# Patient Record
Sex: Male | Born: 2000 | Race: Black or African American | Hispanic: No | Marital: Single
Health system: Southern US, Community
[De-identification: ages and names within clinical notes are randomized; demographics above are authoritative.]

---
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
## 2024-07-19 DIAGNOSIS — F17220 Nicotine dependence, chewing tobacco, uncomplicated: Secondary | ICD-10-CM | POA: Insufficient documentation

## 2024-07-19 DIAGNOSIS — R109 Unspecified abdominal pain: Secondary | ICD-10-CM | POA: Insufficient documentation

## 2024-07-19 DIAGNOSIS — F1010 Alcohol abuse, uncomplicated: Secondary | ICD-10-CM | POA: Insufficient documentation

## 2024-07-19 DIAGNOSIS — F1290 Cannabis use, unspecified, uncomplicated: Secondary | ICD-10-CM | POA: Insufficient documentation

## 2024-07-19 DIAGNOSIS — I4891 Unspecified atrial fibrillation: Principal | ICD-10-CM | POA: Insufficient documentation

## 2024-07-19 DIAGNOSIS — F419 Anxiety disorder, unspecified: Secondary | ICD-10-CM | POA: Insufficient documentation

## 2024-07-20 ENCOUNTER — Other Ambulatory Visit: Payer: Self-pay

## 2024-07-20 ENCOUNTER — Observation Stay (HOSPITAL_COMMUNITY): Payer: Self-pay

## 2024-07-20 ENCOUNTER — Emergency Department (HOSPITAL_COMMUNITY): Payer: Self-pay

## 2024-07-20 ENCOUNTER — Observation Stay (HOSPITAL_COMMUNITY)
Admission: EM | Admit: 2024-07-20 | Discharge: 2024-07-20 | Disposition: A | Discharge status: Home / Self Care | Payer: Self-pay | Attending: Cardiovascular Disease | Admitting: Cardiovascular Disease

## 2024-07-20 DIAGNOSIS — I4891 Unspecified atrial fibrillation: Principal | ICD-10-CM | POA: Diagnosis present

## 2024-07-20 LAB — ECHOCARDIOGRAM COMPLETE
Area-P 1/2: 4.06 cm2
S' Lateral: 3.2 cm
Weight: 2880 [oz_av]

## 2024-07-20 LAB — CBC
HCT: 46.8 % (ref 39.0–52.0)
HCT: 49.8 % (ref 39.0–52.0)
Hemoglobin: 15.1 g/dL (ref 13.0–17.0)
Hemoglobin: 15.4 g/dL (ref 13.0–17.0)
MCH: 28.1 pg (ref 26.0–34.0)
MCH: 29.2 pg (ref 26.0–34.0)
MCHC: 30.9 g/dL (ref 30.0–36.0)
MCHC: 32.3 g/dL (ref 30.0–36.0)
MCV: 90.3 fL (ref 80.0–100.0)
MCV: 90.9 fL (ref 80.0–100.0)
Platelets: 248 K/uL (ref 150–400)
Platelets: 268 K/uL (ref 150–400)
RBC: 5.18 MIL/uL (ref 4.22–5.81)
RBC: 5.48 MIL/uL (ref 4.22–5.81)
RDW: 13.3 % (ref 11.5–15.5)
RDW: 13.6 % (ref 11.5–15.5)
WBC: 6.5 K/uL (ref 4.0–10.5)
WBC: 7.7 K/uL (ref 4.0–10.5)
nRBC: 0 % (ref 0.0–0.2)
nRBC: 0 % (ref 0.0–0.2)

## 2024-07-20 LAB — BASIC METABOLIC PANEL WITH GFR
Anion gap: 10 (ref 5–15)
Anion gap: 9 (ref 5–15)
BUN: 11 mg/dL (ref 6–20)
BUN: 11 mg/dL (ref 6–20)
CO2: 29 mmol/L (ref 22–32)
CO2: 25 mmol/L (ref 22–32)
Calcium: 9.5 mg/dL (ref 8.9–10.3)
Calcium: 8.7 mg/dL — ABNORMAL LOW (ref 8.9–10.3)
Chloride: 106 mmol/L (ref 98–111)
Chloride: 110 mmol/L (ref 98–111)
Creatinine, Ser: 0.92 mg/dL (ref 0.61–1.24)
Creatinine, Ser: 0.77 mg/dL (ref 0.61–1.24)
GFR, Estimated: >60 mL/min (ref >60)
GFR, Estimated: >60 mL/min (ref >60)
Glucose, Bld: 75 mg/dL (ref 70–99)
Glucose, Bld: 96 mg/dL (ref 70–99)
Potassium: 3.8 mmol/L (ref 3.5–5.1)
Potassium: 4.2 mmol/L (ref 3.5–5.1)
Sodium: 144 mmol/L (ref 135–145)
Sodium: 144 mmol/L (ref 135–145)

## 2024-07-20 LAB — TROPONIN T, HIGH SENSITIVITY
Troponin T High Sensitivity: <15 ng/L (ref 0–19)
Troponin T High Sensitivity: <15 ng/L (ref 0–19)

## 2024-07-20 LAB — HIV ANTIBODY (ROUTINE TESTING W REFLEX): HIV Screen 4th Generation wRfx: NONREACTIVE

## 2024-07-20 LAB — MAGNESIUM: Magnesium: 2.2 mg/dL (ref 1.7–2.4)

## 2024-07-20 LAB — TSH: TSH: 4.37 u[IU]/mL (ref 0.350–4.500)

## 2024-07-20 LAB — HEPARIN LEVEL (UNFRACTIONATED): Heparin Unfractionated: 0.38 [IU]/mL (ref 0.30–0.70)

## 2024-07-20 MED ORDER — ASPIRIN 81 MG PO TBEC: 81.0000 mg | DELAYED_RELEASE_TABLET | Freq: Every day | ORAL | Status: DC

## 2024-07-20 MED ORDER — METOPROLOL SUCCINATE ER 25 MG PO TB24: 25.0000 mg | ORAL_TABLET | Freq: Every day | ORAL | Status: DC

## 2024-07-20 MED ORDER — HEPARIN (PORCINE) 25000 UT/250ML-% IV SOLN
1200.0000 [IU]/h | INTRAVENOUS | Status: DC
  Administered 2024-07-20: 1200 [IU]/h via INTRAVENOUS
  Filled 2024-07-20: qty 250

## 2024-07-20 MED ORDER — METOPROLOL TARTRATE 5 MG/5ML IV SOLN
5.0000 mg | Freq: Once | INTRAVENOUS | Status: AC
  Administered 2024-07-20: 5 mg via INTRAVENOUS
  Filled 2024-07-20: qty 5

## 2024-07-20 MED ORDER — LACTATED RINGERS IV BOLUS
1000.0000 mL | Freq: Once | INTRAVENOUS | Status: AC
  Administered 2024-07-20: 1000 mL via INTRAVENOUS

## 2024-07-20 MED ORDER — ASPIRIN 81 MG PO TBEC: 81.0000 mg | DELAYED_RELEASE_TABLET | Freq: Every day | ORAL | Status: AC

## 2024-07-20 MED ORDER — DILTIAZEM HCL-DEXTROSE 125-5 MG/125ML-% IV SOLN (PREMIX)
5.0000 mg/h | INTRAVENOUS | Status: DC
  Administered 2024-07-20: 5 mg/h via INTRAVENOUS
  Filled 2024-07-20: qty 125

## 2024-07-20 MED ORDER — HEPARIN BOLUS VIA INFUSION
4000.0000 [IU] | Freq: Once | INTRAVENOUS | Status: AC
  Administered 2024-07-20: 4000 [IU] via INTRAVENOUS
  Filled 2024-07-20: qty 4000

## 2024-07-20 MED ORDER — METOPROLOL SUCCINATE ER 25 MG PO TB24: 25.0000 mg | ORAL_TABLET | Freq: Every day | ORAL | 3 refills | Status: AC

## 2024-07-20 MED ORDER — DILTIAZEM HCL ER COATED BEADS 120 MG PO CP24
120.0000 mg | ORAL_CAPSULE | Freq: Every day | ORAL | Status: DC
  Administered 2024-07-20: 120 mg via ORAL
  Filled 2024-07-20: qty 1

## 2024-07-20 MED ORDER — DILTIAZEM HCL ER COATED BEADS 120 MG PO CP24: 120.0000 mg | ORAL_CAPSULE | Freq: Every day | ORAL | 3 refills | Status: AC

## 2024-07-20 NOTE — ED Triage Notes (Signed)
 Pt reports SOB, chest discomfort, and racing heart that started today. Sts this happened approx 2 hours after smoking marijuana.

## 2024-07-20 NOTE — Progress Notes (Signed)
  Echocardiogram 2D Echocardiogram has been performed.  Koleen KANDICE Popper, RDCS 07/20/2024, 12:42 PM

## 2024-07-20 NOTE — Progress Notes (Signed)
 PHARMACY - ANTICOAGULATION CONSULT NOTE  Pharmacy Consult for heparin Indication: atrial fibrillation  Allergies  Allergen Reactions   Shellfish Allergy Anaphylaxis    Patient Measurements: Weight: 81.6 kg (180 lb)81.6kg  Vital Signs: Temp: 98.1 F (36.7 C) (11/01 0927) Temp Source: Oral (11/01 0927) BP: 100/85 (11/01 1100) Pulse Rate: 81 (11/01 1100)  Labs: Recent Labs    07/20/24 0026 07/20/24 0449 07/20/24 1031  HGB 15.4 15.1  --   HCT 49.8 46.8  --   PLT 248 268  --   HEPARINUNFRC  --   --  0.38  CREATININE 0.92 0.77  --     CrCl cannot be calculated (Unknown ideal weight.).   Medical History: No past medical history on file.   Assessment: 23 yo male with SOB, chest discomfort and racing heart.  Pharmacy to dose heparin drip for AFib, no prior AC noted  CBC WNL, Scr 0.92  07/20/2024 First heparin level = 0.38 - therapeutic after 4000 units bolus & heparin drip at 1200 units/hr No bleeding reported.  Afib >> NSR  Goal of Therapy:  Heparin level 0.3-0.7 units/ml Monitor platelets by anticoagulation protocol: Yes   Plan:  continue heparin drip at 1200 units/hr Daily CBC & heparin level ? DC heparin  Rosaline IVAR Edison, Pharm.D Use secure chat for questions 07/20/2024 12:04 PM

## 2024-07-20 NOTE — Progress Notes (Signed)
 Converted to Sinus rhythm at 9:42 AM. Patient c/o occasional abdominal pain with h/o hernia repair 3 years ago. He denies nausea or vomiting. Discussed medications, aspirin use and avoiding smoking Marijuana, Caffeine and other stimulants. Awaiting echocardiogram. Possible discharge home this afternoon.  Salena Negri, MD [phone] 07/20/2024, 11:35 AM

## 2024-07-20 NOTE — Progress Notes (Signed)
 Claudene MD at bedside, Okay to discharge patient. Both PIV removed, discharge instructions discussed. All questions answered. All belongings accounted for prior to discharge. Walked to ER side entrance to await ride.

## 2024-07-20 NOTE — H&P (Signed)
 Referring Physician: Jerral Meth, MD   Joel [name] is an 23 y.o. [name].                       Chief Complaint: Shortness of breath and racing heart  HPI: Joel [name] with no prior h/o heart disease has palpitations, chest discomfort and shortness of breath 2 hours after smoking Marijuana. CBC and BMET are normal. TSH is pending. EKG shows atrial fibrillation with RVR. IV metoprolol worked little.  Past medical history: Negative for DM, HTN, HLD. Positive for smoking, alcohol intake.    Past surgical history: None.  Family history: Negative for premature cardiac disease.  Social History:  reports that he is a non-smoker but has been exposed to tobacco smoke. He has never used smokeless tobacco. He reports current alcohol use. He reports that he does not use drugs except marijuana.  Allergies:  Allergies  Allergen Reactions   Shellfish Allergy Anaphylaxis    (Not in a hospital admission)   Results for orders placed or performed during the hospital encounter of 07/20/24 (from the past 48 hours)  Basic metabolic panel     Status: None   Collection Time: 07/20/24 12:26 AM  Result Value Ref Range   Sodium 144 135 - 145 mmol/L   Potassium 3.8 3.5 - 5.1 mmol/L   Chloride 106 98 - 111 mmol/L   CO2 29 22 - 32 mmol/L   Glucose, Bld 75 70 - 99 mg/dL    Comment: Glucose reference range applies only to samples taken after fasting for at least 8 hours.   BUN 11 6 - 20 mg/dL   Creatinine, Ser 9.07 0.61 - 1.24 mg/dL   Calcium 9.5 8.9 - 10.3 mg/dL   GFR, Estimated >39 >39 mL/min    Comment: (NOTE) Calculated using the CKD-EPI Creatinine Equation (2021)    Anion gap 10 5 - 15    Comment: Performed at [facility], [address] [place], [place] [address]  CBC     Status: None   Collection Time: 07/20/24 12:26 AM  Result Value Ref Range   WBC 7.7 4.0 - 10.5 K/uL   RBC 5.48 4.22 - 5.81 MIL/uL   Hemoglobin 15.4 13.0 - 17.0 g/dL   HCT 50.1  60.9 - 47.9 %   MCV 90.9 80.0 - 100.0 fL   MCH 28.1 26.0 - 34.0 pg   MCHC 30.9 30.0 - 36.0 g/dL   RDW 86.6 88.4 - 84.4 %   Platelets 248 150 - 400 K/uL   nRBC 0.0 0.0 - 0.2 %    Comment: Performed at [facility], [address] [place], [place] [address]  Troponin T, High Sensitivity     Status: None   Collection Time: 07/20/24 12:26 AM  Result Value Ref Range   Troponin T High Sensitivity <15 0 - 19 ng/L    Comment: (NOTE) Biotin concentrations > 1000 ng/mL falsely decrease TnT results.  Serial cardiac troponin measurements are suggested.  Refer to the Links section for chest pain algorithms and additional  guidance. Performed at [facility], [address] [place], [place] [address]   Magnesium     Status: None   Collection Time: 07/20/24 12:26 AM  Result Value Ref Range   Magnesium 2.2 1.7 - 2.4 mg/dL    Comment: Performed at [facility], [address] [place], [place] [address]  Troponin T, High Sensitivity  Status: None   Collection Time: 07/20/24  2:45 AM  Result Value Ref Range   Troponin T High Sensitivity <15 0 - 19 ng/L    Comment: (NOTE) Biotin concentrations > 1000 ng/mL falsely decrease TnT results.  Serial cardiac troponin measurements are suggested.  Refer to the Links section for chest pain algorithms and additional  guidance. Performed at [facility], [address] [place], [place] [address]    DG Chest Portable 1 View Result Date: 07/20/2024 CLINICAL DATA:  Chest pressure short of breath EXAM: PORTABLE CHEST 1 VIEW COMPARISON:  None Available. FINDINGS: The heart size and mediastinal contours are within normal limits. Both lungs are clear. The visualized skeletal structures are unremarkable. IMPRESSION: No active disease. Electronically Signed   By: Luke Bun M.D.   On: 07/20/2024 01:06    Review Of Systems Constitutional: No fever, chills, weight loss or  gain. Eyes: No vision change, wears glasses. No discharge or pain. Ears: No hearing loss, No tinnitus. Respiratory: No asthma, COPD, pneumonias. Positive shortness of breath. No hemoptysis. Cardiovascular: Positive chest pain, palpitation, no leg edema. Gastrointestinal: No nausea, vomiting, diarrhea, constipation. No GI bleed. No hepatitis. Genitourinary: No dysuria, hematuria, kidney stone. No incontinance. Neurological: No headache, stroke, seizures.  Psychiatry: No psych facility admission for anxiety, depression, suicide. No detox. Skin: No rash. Musculoskeletal: No joint pain, fibromyalgia. No neck pain, back pain. Lymphadenopathy: No lymphadenopathy. Hematology: No anemia or easy bruising.   Blood pressure 125/77, pulse 90, temperature 97.8 F (36.6 C), temperature source Oral, resp. rate 15, SpO2 92%. There is no height or weight on file to calculate BMI. General appearance: alert, cooperative, appears stated age and no distress Head: Normocephalic, atraumatic. Eyes: Luebke eyes, pink conjunctiva, corneas clear.  Neck: No adenopathy, no carotid bruit, no JVD, supple, symmetrical, trachea midline and thyroid not enlarged. Resp: Clear to auscultation bilaterally. Cardio: Tachycardic, Irregular rate and rhythm, S1, S2 normal, II/VI systolic murmur, no click, rub or gallop GI: Soft, non-tender; bowel sounds normal; no organomegaly. Extremities: No edema, cyanosis or clubbing. Skin: Warm and dry.  Neurologic: Alert and oriented X 3, normal strength. Normal coordination.  Assessment/Plan Atrial fibrillation with RVR, CHA2DS2VASc score of 0 Chest pain Palpitation Shortness of breath  Plan: Check TSH. IV heparin. Echocardiogram for LV function IV diltiazem for rate control. Discussed possible external cardioversion  Time spent: Review of old records, Lab, x-rays, EKG, other cardiac tests, examination, discussion with patient/Doctor/Friend over 70 minutes.  Salena GORMAN Negri,  MD  07/20/2024, 4:02 AM

## 2024-07-20 NOTE — Progress Notes (Signed)
 PHARMACY - ANTICOAGULATION CONSULT NOTE  Pharmacy Consult for heparin Indication: atrial fibrillation  Allergies  Allergen Reactions   Shellfish Allergy Anaphylaxis    Patient Measurements:  81.6kg  Vital Signs: Temp: 97.8 F (36.6 C) (11/01 0000) Temp Source: Oral (11/01 0000) BP: 125/77 (11/01 0220) Pulse Rate: 90 (11/01 0330)  Labs: Recent Labs    07/20/24 0026  HGB 15.4  HCT 49.8  PLT 248  CREATININE 0.92    CrCl cannot be calculated (Unknown ideal weight.).   Medical History: No past medical history on file.   Assessment: 23 yo male with SOB, chest discomfort and racing heart.  Pharmacy to dose heparin drip for AFib, no prior AC noted  CBC WNL, Scr 0.92  Goal of Therapy:  Heparin level 0.3-0.7 units/ml Monitor platelets by anticoagulation protocol: Yes   Plan:  Heparin bolus 4000 units x 1 Start heparin drip at 1200 units/hr Heparin level in 6 hours Daily CBC   Leeroy Mace RPh 07/20/2024, 4:05 AM

## 2024-07-20 NOTE — Discharge Summary (Signed)
 Physician Discharge Summary  Patient ID: Joel [name] MRN: [id number] DOB/AGE: 2001-05-05 23 y.o.  Admit date: 07/20/2024 Discharge date: 07/20/2024  Admission Diagnoses: Atrial fibrillation with RVR, CHA2DS2VASc score of 0 Chest pain Palpitation Shortness of breath  Discharge Diagnoses:  Principal Problem:   Atrial fibrillation with RVR (HCC) Active Problems: Chest pain Shortness of breath Non-specific abdominal pain Anxiety  Discharged Condition: fair  Hospital Course: 23 years old black male with no prior h/o heart disease has palpitations, chest discomfort and shortness of breath 2 hours after smoking Marijuana. CBC and BMET are normal. TSH is pending. EKG shows atrial fibrillation with RVR. IV metoprolol worked little. He responded well to IV diltiazem and converted to sinus rhythm in few hours. His echocardiogram showed near normal LV systolic function. He was placed on oral diltiazem and aspirin. He was advised to refrain from smoking marijuana and discontinue or limit caffeine, sugary sodas and chocolate intake. He was also advised to limit alcohol to one drink or none. He will also avoid large quantity of any liquid including water at one time. He will see primary care in 2 weeks and see me in 1 week.  Consults: cardiology  Significant Diagnostic Studies: labs: Normal CBC, BMET, TSH.  EKG: A. Fib with RVR.  CXR: Unremarkable.  X-ray abdomen: report pending.  Echocardiogram: Near normal LV systolic function with questionable basal myocardial hypokinesia.  Treatments: cardiac meds: metoprolol and diltiazem  Discharge Exam: Blood pressure 100/85, pulse 81, temperature 98.1 F (36.7 C), temperature source Oral, resp. rate (!) 22, weight 81.6 kg, SpO2 100%. General appearance: alert, cooperative and appears stated age. Head: Normocephalic, atraumatic. Eyes: Zent eyes, pink conjunctiva, corneas clear.   Neck: No adenopathy, no carotid bruit, no JVD,  supple, symmetrical, trachea midline and thyroid not enlarged. Resp: Clear to auscultation bilaterally. Cardio: Regular rate and rhythm, S1, S2 normal, II/VI systolic murmur, no click, rub or gallop. GI: Soft, non-tender; bowel sounds normal; no organomegaly. Extremities: No edema, cyanosis or clubbing. Skin: Warm and dry.  Neurologic: Alert and oriented X 3, normal strength and tone. Normal coordination and gait.  Disposition: Discharge disposition: 01-Home or Self Care        Allergies as of 07/20/2024       Reactions   Shellfish Allergy Anaphylaxis        Medication List     STOP taking these medications    traMADol  50 MG tablet Commonly known as: ULTRAM        TAKE these medications    aspirin EC 81 MG tablet Take 1 tablet (81 mg total) by mouth daily. Swallow whole.   diltiazem 120 MG 24 hr capsule Commonly known as: CARDIZEM CD Take 1 capsule (120 mg total) by mouth daily with breakfast.   metoprolol succinate 25 MG 24 hr tablet Commonly known as: TOPROL-XL Take 1 tablet (25 mg total) by mouth daily at 6 PM.        Follow-up Information     [facility]. Schedule an appointment as soon as possible for a visit in 2 week(s).   Specialty: Pediatrics Contact information: [address] [place] [address] [phone]         [name], MD. Schedule an appointment as soon as possible for a visit in 1 week(s).   Specialty: Cardiology Contact information: [address] [place] [address] [phone]                 Time spent: Review  of old chart, current chart, lab, x-ray, cardiac tests and discussion with patient over 60 minutes.  Signed: Salena GORMAN Negri 07/20/2024, 1:39 PM

## 2024-07-20 NOTE — ED Provider Notes (Signed)
 [place] EMERGENCY DEPARTMENT AT [facility] Provider Note   CSN: [id number] Arrival date & time: 07/19/24  2347     History Chief Complaint  Patient presents with   Shortness of Breath    HPI Joel [name] is a 23 y.o. male presenting for chief complaint of SOB and palpitations. Patient states that he smoked MJ then this started. No history of similar Denies any severe medical history. Patient's recorded medical, surgical, social, medication list and allergies were reviewed in the Snapshot window as part of the initial history.   Review of Systems   Review of Systems  Constitutional:  Negative for chills and fever.  HENT:  Negative for ear pain and sore throat.   Eyes:  Negative for pain and visual disturbance.  Respiratory:  Positive for shortness of breath. Negative for cough.   Cardiovascular:  Positive for chest pain and palpitations.  Gastrointestinal:  Negative for abdominal pain and vomiting.  Genitourinary:  Negative for dysuria and hematuria.  Musculoskeletal:  Negative for arthralgias and back pain.  Skin:  Negative for color change and rash.  Neurological:  Negative for seizures and syncope.  All other systems reviewed and are negative.   Physical Exam Updated Vital Signs BP 98/64   Pulse 78   Temp 98 F (36.7 C)   Resp 15   Wt 81.6 kg   SpO2 95%  Physical Exam Vitals and nursing note reviewed.  Constitutional:      General: He is not in acute distress.    Appearance: He is well-developed.  HENT:     Head: Normocephalic and atraumatic.  Eyes:     Conjunctiva/sclera: Conjunctivae normal.  Cardiovascular:     Rate and Rhythm: Normal rate and regular rhythm.     Heart sounds: No murmur heard. Pulmonary:     Effort: Pulmonary effort is normal. No respiratory distress.     Breath sounds: Normal breath sounds.  Abdominal:     Palpations: Abdomen is soft.     Tenderness: There is no abdominal tenderness.  Musculoskeletal:         General: No swelling.     Cervical back: Neck supple.  Skin:    General: Skin is warm and dry.     Capillary Refill: Capillary refill takes less than 2 seconds.  Neurological:     Mental Status: He is alert.  Psychiatric:        Mood and Affect: Mood normal.      ED Course/ Medical Decision Making/ A&P Clinical Course as of 07/20/24 0706  Sat Jul 20, 2024  0357 WBC: 7.7 [CC]    Clinical Course User Index [CC] Jerral Meth, MD    Procedures .Critical Care  Performed by: Jerral Meth, MD Authorized by: Jerral Meth, MD   Critical care provider statement:    Critical care time (minutes):  50   Critical care was necessary to treat or prevent imminent or life-threatening deterioration of the following conditions:  Circulatory failure and cardiac failure   Critical care was time spent personally by me on the following activities:  Development of treatment plan with patient or surrogate, discussions with consultants, evaluation of patient's response to treatment, examination of patient, ordering and review of laboratory studies, ordering and review of radiographic studies, ordering and performing treatments and interventions, pulse oximetry, re-evaluation of patient's condition and review of old charts   Care discussed with: admitting provider    Tachycardia >150 Medications Ordered in ED Medications  diltiazem (CARDIZEM)  125 mg in dextrose 5% 125 mL (1 mg/mL) infusion (10 mg/hr Intravenous Infusion Verify 07/20/24 0605)  heparin ADULT infusion 100 units/mL (25000 units/250mL) (1,200 Units/hr Intravenous New Bag/Given 07/20/24 0434)  lactated ringers bolus 1,000 mL (0 mLs Intravenous Stopped 07/20/24 0152)  metoprolol tartrate (LOPRESSOR) injection 5 mg (5 mg Intravenous Given 07/20/24 0101)  metoprolol tartrate (LOPRESSOR) injection 5 mg (5 mg Intravenous Given 07/20/24 0152)  heparin bolus via infusion 4,000 Units (4,000 Units Intravenous Bolus from Bag 07/20/24 0435)     Medical Decision Making:   This is a 23 year old male presenting with a chief complaint of shortness of breath, palpitations.  Stated that he smoked marijuana earlier tonight and then acutely started feeling palpitations and dyspnea.  He is ambulatory.  No history of similar.  This history of present illness and physical exam findings are most consistent with developing atrial fibrillation likely substance-induced. May be underlying metabolic etiology, lab work sent off for evaluation. Reassessment On reassessment he remained tachycardic despite multiple doses of IV fluids, beta-blocker and ongoing treatment. Cardiology was consulted who recommended admission for diltiazem and consideration for TEE cardioversion.  Disposition:   Based on the above findings, I believe this patient is stable for admission.    Patient/family educated about specific findings on our evaluation and explained exact reasons for admission.  Patient/family educated about clinical situation and time was allowed to answer questions.   Admission team communicated with and agreed with need for admission. Patient admitted. Patient ready to move at this time.     Emergency Department Medication Summary:   Medications  diltiazem (CARDIZEM) 125 mg in dextrose 5% 125 mL (1 mg/mL) infusion (10 mg/hr Intravenous Infusion Verify 07/20/24 0605)  heparin ADULT infusion 100 units/mL (25000 units/250mL) (1,200 Units/hr Intravenous New Bag/Given 07/20/24 0434)  lactated ringers bolus 1,000 mL (0 mLs Intravenous Stopped 07/20/24 0152)  metoprolol tartrate (LOPRESSOR) injection 5 mg (5 mg Intravenous Given 07/20/24 0101)  metoprolol tartrate (LOPRESSOR) injection 5 mg (5 mg Intravenous Given 07/20/24 0152)  heparin bolus via infusion 4,000 Units (4,000 Units Intravenous Bolus from Bag 07/20/24 0435)        Clinical Impression:  1. Atrial fibrillation with RVR (HCC)      Admit   Final Clinical Impression(s) / ED Diagnoses Final  diagnoses:  Atrial fibrillation with RVR [facility])    Rx / DC Orders ED Discharge Orders     None         Jerral Meth, MD 07/20/24 [id number]
